# Patient Record
Sex: Male | Born: 1990 | Race: White | Hispanic: No | Marital: Married | Smoking: Never smoker
Health system: Southern US, Community
[De-identification: ages and names within clinical notes are randomized; demographics above are authoritative.]

## PROBLEM LIST (undated history)

## (undated) DIAGNOSIS — J45909 Unspecified asthma, uncomplicated: Secondary | ICD-10-CM

## (undated) DIAGNOSIS — E119 Type 2 diabetes mellitus without complications: Secondary | ICD-10-CM

## (undated) HISTORY — PX: FRACTURE SURGERY: SHX138

---
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
## 2016-01-29 ENCOUNTER — Ambulatory Visit: Payer: Self-pay | Admitting: Podiatry

## 2019-11-05 ENCOUNTER — Other Ambulatory Visit: Payer: Self-pay

## 2019-11-05 ENCOUNTER — Encounter (HOSPITAL_BASED_OUTPATIENT_CLINIC_OR_DEPARTMENT_OTHER): Payer: Self-pay

## 2019-11-05 ENCOUNTER — Emergency Department (HOSPITAL_BASED_OUTPATIENT_CLINIC_OR_DEPARTMENT_OTHER)
Admission: EM | Admit: 2019-11-05 | Discharge: 2019-11-06 | Disposition: A | Discharge status: Home / Self Care | Payer: 59 | Attending: Emergency Medicine | Admitting: Emergency Medicine

## 2019-11-05 ENCOUNTER — Emergency Department (HOSPITAL_BASED_OUTPATIENT_CLINIC_OR_DEPARTMENT_OTHER): Payer: 59

## 2019-11-05 DIAGNOSIS — S42022A Displaced fracture of shaft of left clavicle, initial encounter for closed fracture: Secondary | ICD-10-CM | POA: Insufficient documentation

## 2019-11-05 DIAGNOSIS — W19XXXA Unspecified fall, initial encounter: Secondary | ICD-10-CM | POA: Diagnosis not present

## 2019-11-05 DIAGNOSIS — Y92838 Other recreation area as the place of occurrence of the external cause: Secondary | ICD-10-CM | POA: Diagnosis not present

## 2019-11-05 DIAGNOSIS — F1210 Cannabis abuse, uncomplicated: Secondary | ICD-10-CM | POA: Diagnosis not present

## 2019-11-05 DIAGNOSIS — Y998 Other external cause status: Secondary | ICD-10-CM | POA: Insufficient documentation

## 2019-11-05 DIAGNOSIS — E119 Type 2 diabetes mellitus without complications: Secondary | ICD-10-CM | POA: Insufficient documentation

## 2019-11-05 DIAGNOSIS — Y9323 Activity, snow (alpine) (downhill) skiing, snow boarding, sledding, tobogganing and snow tubing: Secondary | ICD-10-CM | POA: Insufficient documentation

## 2019-11-05 DIAGNOSIS — S4992XA Unspecified injury of left shoulder and upper arm, initial encounter: Secondary | ICD-10-CM | POA: Diagnosis present

## 2019-11-05 HISTORY — DX: Type 2 diabetes mellitus without complications: E11.9

## 2019-11-05 MED ORDER — MORPHINE SULFATE 15 MG PO TABS: 15.0000 mg | ORAL_TABLET | ORAL | 0 refills | Status: DC | PRN

## 2019-11-05 NOTE — Discharge Instructions (Signed)

## 2019-11-05 NOTE — ED Triage Notes (Signed)
Pt was snowboarding and fell onto his L shoulder. Pt c/o pain to L collar bone.

## 2019-11-05 NOTE — ED Provider Notes (Signed)
[facility] EMERGENCY DEPARTMENT Provider Note   CSN: [id number] Arrival date & time: 11/05/19  2208     History Chief Complaint  Patient presents with  . Arm Injury    Omar Gentry is a 29 y.o. male29 yo .  29 yo M with a chief complaint of left shoulder pain.  Patient was snowboarding earlier today and he switched from going backwards to forward since he lost his balance and fell onto his left elbow.  Felt a pop in his left shoulder and had pain there afterwards.  Happened about 6 hours ago.  Denies other injury.  Pain with movement of the left shoulder.  Sharp and shooting.   The history is provided by the patient.  Arm Injury Location:  Clavicle Clavicle location:  L clavicle Injury: yes   Time since incident:  6 hours Mechanism of injury: fall   Fall:    Fall occurred: Snowboarding.   Height of fall:  Standing   Impact surface:  [employer] of impact: Left elbow.   Entrapped after fall: no   Pain details:    Quality:  Sharp   Radiates to:  Does not radiate   Severity:  Moderate   Onset quality:  Gradual   Duration:  6 hours   Timing:  Constant   Progression:  Worsening Handedness:  Right-handed Prior injury to area:  No Relieved by:  Nothing Worsened by:  Bearing weight and movement Ineffective treatments:  None tried Associated symptoms: no fever        Past Medical History:  Diagnosis Date  . Diabetes mellitus without complication (HCC)     There are no problems to display for this patient.   Past Surgical History:  Procedure Laterality Date  . FRACTURE SURGERY         No family history on file.  Social History   Tobacco Use  . Smoking status: Never Smoker  . Smokeless tobacco: Never Used  Substance Use Topics  . Alcohol use: Yes  . Drug use: Yes    Types: Marijuana    Home Medications Prior to Admission medications   Medication Sig Start Date End Date Taking? Authorizing Provider  morphine (MSIR) 15 MG tablet Take 1  tablet (15 mg total) by mouth every 4 (four) hours as needed for severe pain. 11/05/19   Melene Plan, DO    Allergies    Sulfa antibiotics  Review of Systems   Review of Systems  Constitutional: Negative for chills and fever.  HENT: Negative for congestion and facial swelling.   Eyes: Negative for discharge and visual disturbance.  Respiratory: Negative for shortness of breath.   Cardiovascular: Negative for chest pain and palpitations.  Gastrointestinal: Negative for abdominal pain, diarrhea and vomiting.  Musculoskeletal: Positive for arthralgias. Negative for myalgias.  Skin: Negative for color change and rash.  Neurological: Negative for tremors, syncope and headaches.  Psychiatric/Behavioral: Negative for confusion and dysphoric mood.    Physical Exam Updated Vital Signs BP (!) 159/79 (BP Location: Right Arm)   Pulse (!) 106   Temp 99.6 F (37.6 C) (Oral)   Resp 20   Ht 5\' 11"  (1.803 m)   Wt 81.6 kg   SpO2 97%   BMI 25.09 kg/m   Physical Exam Vitals and nursing note reviewed.  Constitutional:      Appearance: He is well-developed.  HENT:     Head: Normocephalic and atraumatic.  Eyes:     Pupils: Pupils are equal, round,  and reactive to light.  Neck:     Vascular: No JVD.  Cardiovascular:     Rate and Rhythm: Normal rate and regular rhythm.     Heart sounds: No murmur. No friction rub. No gallop.   Pulmonary:     Effort: No respiratory distress.     Breath sounds: No wheezing.  Abdominal:     General: There is no distension.     Tenderness: There is no guarding or rebound.  Musculoskeletal:        General: Tenderness present. Normal range of motion.     Cervical back: Normal range of motion and neck supple.     Comments: No tenting of the skin.  Tenderness about the left clavicle.  No pain at the elbow with full range of motion.  No pain to the humerus.  Pulse motor and sensation are intact distally.  Skin:    Coloration: Skin is not pale.     Findings: No  rash.  Neurological:     Mental Status: He is alert and oriented to person, place, and time.  Psychiatric:        Behavior: Behavior normal.     ED Results / Procedures / Treatments   Labs (all labs ordered are listed, but only abnormal results are displayed) Labs Reviewed - No data to display  EKG None  Radiology DG Clavicle Left  Result Date: 11/05/2019 CLINICAL DATA:  Fall while snowboarding EXAM: LEFT CLAVICLE - 2+ VIEWS COMPARISON:  None. FINDINGS: Multipart midclavicular fracture with superior angulation of the proximal and distal fragments in inferior angulation of the displaced middle fragment. Acromioclavicular alignment is maintained. Overlying soft tissue swelling is present. Glenohumeral alignment is grossly maintained. No other acute abnormality of the imaged left chest wall and lung. IMPRESSION: Multipart displaced midclavicular fracture. Electronically Signed   By: Kreg Shropshire M.D.   On: 11/05/2019 22:47    Procedures Procedures (including critical care time)  Medications Ordered in ED Medications - No data to display  ED Course  I have reviewed the triage vital signs and the nursing notes.  Pertinent labs & imaging results that were available during my care of the patient were reviewed by me and considered in my medical decision making (see chart for details).    MDM Rules/Calculators/A&P                      29 yo M with a chief complaints of left shoulder pain.  Plain films viewed by me with comminuted left clavicle fracture.  Will place in a sling.  Ortho follow-up.  11:56 PM:  I have discussed the diagnosis/risks/treatment options with the patient and believe the pt to be eligible for discharge home to follow-up with Ortho. We also discussed returning to the ED immediately if new or worsening sx occur. We discussed the sx which are most concerning (e.g., sudden worsening pain, fever, inability to tolerate by mouth) that necessitate immediate return.  Medications administered to the patient during their visit and any new prescriptions provided to the patient are listed below.  Medications given during this visit Medications - No data to display   The patient appears reasonably screen and/or stabilized for discharge and I doubt any other medical condition or other [facility] requiring further screening, evaluation, or treatment in the ED at this time prior to discharge.   Final Clinical Impression(s) / ED Diagnoses Final diagnoses:  Closed displaced fracture of shaft of left clavicle, initial encounter  Rx / DC Orders ED Discharge Orders         Ordered    morphine (MSIR) 15 MG tablet  Every 4 hours PRN     11/05/19 2347           Deno Etienne, DO 11/05/19 2356

## 2019-11-05 NOTE — ED Notes (Signed)
Pt endorses 1000mg  tylenol at 1730 with mild relief

## 2019-11-08 ENCOUNTER — Ambulatory Visit (INDEPENDENT_AMBULATORY_CARE_PROVIDER_SITE_OTHER): Payer: 59 | Admitting: Orthopaedic Surgery

## 2019-11-08 ENCOUNTER — Other Ambulatory Visit: Payer: Self-pay

## 2019-11-08 ENCOUNTER — Encounter: Payer: Self-pay | Admitting: Orthopaedic Surgery

## 2019-11-08 ENCOUNTER — Encounter (HOSPITAL_BASED_OUTPATIENT_CLINIC_OR_DEPARTMENT_OTHER): Payer: Self-pay | Admitting: Orthopaedic Surgery

## 2019-11-08 DIAGNOSIS — S42025A Nondisplaced fracture of shaft of left clavicle, initial encounter for closed fracture: Secondary | ICD-10-CM

## 2019-11-08 DIAGNOSIS — S42022A Displaced fracture of shaft of left clavicle, initial encounter for closed fracture: Secondary | ICD-10-CM | POA: Insufficient documentation

## 2019-11-08 NOTE — H&P (Signed)
Omar Gentry is an 29 y.o. male.   Chief Complaint: Left clavicle fracture HPI: 29 year old white male history of left clavicle fracture is being seen for preop evaluation.  Patient suffered this injury while snowboarding November 05, 2019.  Seen at the emergency room and put in a shoulder immobilizer.  Denies any other injuries.  Past Medical History:  Diagnosis Date  . Diabetes mellitus without complication [facility])     Past Surgical History:  Procedure Laterality Date  . FRACTURE SURGERY      No family history on file. Social History:  reports that he has never smoked. He has never used smokeless tobacco. He reports current alcohol use. He reports current drug use. Drug: Marijuana.  Allergies:  Allergies  Allergen Reactions  . Sulfa Antibiotics Rash    No medications prior to admission.    No results found for this or any previous visit (from the past 48 hour(s)). No results found.  Review of Systems  Constitutional: Positive for activity change.  HENT: Negative.   Respiratory: Negative.   Cardiovascular: Negative.   Gastrointestinal: Negative.   Genitourinary: Negative.   Musculoskeletal: Positive for neck stiffness.  Neurological: Negative.   Psychiatric/Behavioral: Negative.     There were no vitals taken for this visit. Physical Exam  Constitutional: He is oriented to person, place, and time. He appears well-developed and well-nourished.  HENT:  Head: Normocephalic and atraumatic.  Eyes: Pupils are equal, round, and reactive to light. EOM are normal.  Cardiovascular: Normal rate and normal heart sounds.  Respiratory: Effort normal. No respiratory distress. He has no wheezes.  GI: He exhibits no distension.  Musculoskeletal:        General: Tenderness present.     Cervical back: Normal range of motion.     Comments: Left shoulder immobilizer on  Neurological: He is alert and oriented to person, place, and time.  Skin: Skin is warm and dry.  Psychiatric: He has  a normal mood and affect.     Assessment/Plan Left clavicle fracture.  Patient understands at best treatment option at this point would be ORIF left clavicle.  Surgical procedure discussed.  All questions answered.  Zonia Kief, PA-C 11/08/2019, 4:31 PM

## 2019-11-08 NOTE — Progress Notes (Signed)
Office Visit Note   Patient: Omar Gentry           Date of Birth: Sep 10, 1991           MRN: [id number] Visit Date: 11/08/2019              Requested by: No referring provider defined for this encounter. PCP: Patient, No Pcp Per   Assessment & Plan: Visit Diagnoses:  1. Nondisplaced fracture of shaft of left clavicle, initial encounter for closed fracture     Plan: Patient had sent him MS Contin 15 mg for pain.  He states it made him nauseated and has been using ibuprofen.  Patient has type 1 diabetes risk surgery discussed.  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  We discussed being out of work for several days due to pain medication after the procedure.  Risks of infection was discussed low probability the plate would bothering the need to be removed.  Risk of possible nonunion discussed questions were elicited and answered he understands request to proceed.  He can follow-up 1 week postop.  Follow-Up Instructions: No follow-ups on file.   Orders:  No orders of the defined types were placed in this encounter.  No orders of the defined types were placed in this encounter.     Procedures: No procedures performed   Clinical Data: No additional findings.   Subjective: Chief Complaint  Patient presents with  . Left Shoulder - Fracture    DOI 11/05/2019 Left Clavicle Fracture    HPI 29 year old male was snowboarding at [facility] was reversed footed carving down and did a face plant suffering a displaced left clavicle fracture comminuted with greater than 2 cm displacement.  Date of injury is 11/05/2019 and patient states he wants to proceed with operative fixation to shorten his healing time per his dad's recommendations.  Review of Systems Patient is a type I diabetic on insulin otherwise no other  active health problems   Negative cardiovascular GI respiratory.  Objective: Vital Signs: Ht 5\' 11"  (1.803 m)   Wt  179 lb (81.2 kg)   BMI 24.97 kg/m   Physical Exam Constitutional:      Appearance: He is well-developed.  HENT:     Head: Normocephalic and atraumatic.  Eyes:     Pupils: Pupils are equal, round, and reactive to light.  Neck:     Thyroid: No thyromegaly.     Trachea: No tracheal deviation.  Cardiovascular:     Rate and Rhythm: Normal rate.  Pulmonary:     Effort: Pulmonary effort is normal.     Breath sounds: No wheezing.  Abdominal:     General: Bowel sounds are normal.     Palpations: Abdomen is soft.  Skin:    General: Skin is warm and dry.     Capillary Refill: Capillary refill takes less than 2 seconds.  Neurological:     Mental Status: He is alert and oriented to person, place, and time.  Psychiatric:        Behavior: Behavior normal.        Thought Content: Thought content normal.        Judgment: Judgment normal.     Ortho Exam patient has closed clavicle fracture with significant angulation and prominence of the skin.  Sensation hand is intact.  Axillary sensory lateral deltoid is intact.  Specialty Comments:  No specialty comments available.  Imaging: CLINICAL  DATA:  Fall while snowboarding  EXAM: LEFT CLAVICLE - 2+ VIEWS  COMPARISON:  None.  FINDINGS: Multipart midclavicular fracture with superior angulation of the proximal and distal fragments in inferior angulation of the displaced middle fragment. Acromioclavicular alignment is maintained. Overlying soft tissue swelling is present. Glenohumeral alignment is grossly maintained. No other acute abnormality of the imaged left chest wall and lung.  IMPRESSION: Multipart displaced midclavicular fracture.    PMFS History: Patient Active Problem List   Diagnosis Date Noted  . Nondisplaced fracture of shaft of left clavicle, initial encounter for closed fracture 11/08/2019   Past Medical History:  Diagnosis Date  . Diabetes mellitus without complication (HCC)     No family history on  file.  Past Surgical History:  Procedure Laterality Date  . FRACTURE SURGERY     Social History   Occupational History  . Not on file  Tobacco Use  . Smoking status: Never Smoker  . Smokeless tobacco: Never Used  Substance and Sexual Activity  . Alcohol use: Yes  . Drug use: Yes    Types: Marijuana  . Sexual activity: Not on file

## 2019-11-09 ENCOUNTER — Other Ambulatory Visit: Payer: Self-pay

## 2019-11-09 ENCOUNTER — Other Ambulatory Visit (HOSPITAL_COMMUNITY)
Admission: RE | Admit: 2019-11-09 | Discharge: 2019-11-09 | Disposition: A | Discharge status: Home / Self Care | Payer: 59 | Source: Ambulatory Visit | Attending: Orthopaedic Surgery | Admitting: Orthopaedic Surgery

## 2019-11-09 ENCOUNTER — Encounter (HOSPITAL_BASED_OUTPATIENT_CLINIC_OR_DEPARTMENT_OTHER)
Admission: RE | Admit: 2019-11-09 | Discharge: 2019-11-09 | Disposition: A | Discharge status: Home / Self Care | Payer: 59 | Source: Ambulatory Visit | Attending: Orthopaedic Surgery | Admitting: Orthopaedic Surgery

## 2019-11-09 ENCOUNTER — Ambulatory Visit: Payer: 59 | Admitting: Orthopedic Surgery

## 2019-11-09 DIAGNOSIS — Z01818 Encounter for other preprocedural examination: Secondary | ICD-10-CM | POA: Insufficient documentation

## 2019-11-09 DIAGNOSIS — Z01812 Encounter for preprocedural laboratory examination: Secondary | ICD-10-CM | POA: Diagnosis not present

## 2019-11-09 DIAGNOSIS — Z20822 Contact with and (suspected) exposure to covid-19: Secondary | ICD-10-CM | POA: Diagnosis not present

## 2019-11-09 LAB — COMPREHENSIVE METABOLIC PANEL
ALT: 18 U/L (ref 0–44)
AST: 22 U/L (ref 15–41)
Albumin: 4 g/dL (ref 3.5–5.0)
Alkaline Phosphatase: 66 U/L (ref 38–126)
Anion gap: 11 (ref 5–15)
BUN: 15 mg/dL (ref 6–20)
CO2: 24 mmol/L (ref 22–32)
Calcium: 9.3 mg/dL (ref 8.9–10.3)
Chloride: 101 mmol/L (ref 98–111)
Creatinine, Ser: 0.9 mg/dL (ref 0.61–1.24)
GFR calc Af Amer: >60 mL/min (ref >60)
GFR calc non Af Amer: >60 mL/min (ref >60)
Glucose, Bld: 286 mg/dL — ABNORMAL HIGH (ref 70–99)
Potassium: 5 mmol/L (ref 3.5–5.1)
Sodium: 136 mmol/L (ref 135–145)
Total Bilirubin: 0.3 mg/dL (ref 0.3–1.2)
Total Protein: 6.4 g/dL — ABNORMAL LOW (ref 6.5–8.1)

## 2019-11-09 LAB — CBC
HCT: 44.2 % (ref 39.0–52.0)
Hemoglobin: 15.2 g/dL (ref 13.0–17.0)
MCH: 33.9 pg (ref 26.0–34.0)
MCHC: 34.4 g/dL (ref 30.0–36.0)
MCV: 98.7 fL (ref 80.0–100.0)
Platelets: 302 10*3/uL (ref 150–400)
RBC: 4.48 MIL/uL (ref 4.22–5.81)
RDW: 11.7 % (ref 11.5–15.5)
WBC: 5.8 10*3/uL (ref 4.0–10.5)
nRBC: 0 % (ref 0.0–0.2)

## 2019-11-09 LAB — SURGICAL PCR SCREEN
MRSA, PCR: NEGATIVE
Staphylococcus aureus: NEGATIVE

## 2019-11-09 LAB — SARS CORONAVIRUS 2 (TAT 6-24 HRS): SARS Coronavirus 2: NEGATIVE

## 2019-11-09 NOTE — Progress Notes (Signed)
Glucose- 286, Dr. Arby Barrette aware, will proceed with surgery as scheduled.

## 2019-11-11 ENCOUNTER — Ambulatory Visit (HOSPITAL_COMMUNITY)
Admission: RE | Admit: 2019-11-11 | Discharge: 2019-11-11 | Disposition: A | Discharge status: Home / Self Care | Payer: 59 | Attending: Orthopaedic Surgery | Admitting: Orthopaedic Surgery

## 2019-11-11 ENCOUNTER — Ambulatory Visit (HOSPITAL_BASED_OUTPATIENT_CLINIC_OR_DEPARTMENT_OTHER): Payer: 59 | Admitting: Anesthesiology

## 2019-11-11 ENCOUNTER — Encounter (HOSPITAL_BASED_OUTPATIENT_CLINIC_OR_DEPARTMENT_OTHER): Payer: Self-pay | Admitting: Orthopaedic Surgery

## 2019-11-11 ENCOUNTER — Other Ambulatory Visit: Payer: Self-pay

## 2019-11-11 ENCOUNTER — Encounter (HOSPITAL_BASED_OUTPATIENT_CLINIC_OR_DEPARTMENT_OTHER)
Admission: RE | Disposition: A | Discharge status: Home / Self Care | Payer: Self-pay | Source: Home / Self Care | Attending: Orthopaedic Surgery

## 2019-11-11 ENCOUNTER — Ambulatory Visit (HOSPITAL_COMMUNITY): Payer: 59

## 2019-11-11 DIAGNOSIS — Y9323 Activity, snow (alpine) (downhill) skiing, snow boarding, sledding, tobogganing and snow tubing: Secondary | ICD-10-CM | POA: Insufficient documentation

## 2019-11-11 DIAGNOSIS — Z882 Allergy status to sulfonamides status: Secondary | ICD-10-CM | POA: Insufficient documentation

## 2019-11-11 DIAGNOSIS — Z87891 Personal history of nicotine dependence: Secondary | ICD-10-CM | POA: Diagnosis not present

## 2019-11-11 DIAGNOSIS — E109 Type 1 diabetes mellitus without complications: Secondary | ICD-10-CM | POA: Diagnosis not present

## 2019-11-11 DIAGNOSIS — Z419 Encounter for procedure for purposes other than remedying health state, unspecified: Secondary | ICD-10-CM

## 2019-11-11 DIAGNOSIS — J45909 Unspecified asthma, uncomplicated: Secondary | ICD-10-CM | POA: Insufficient documentation

## 2019-11-11 DIAGNOSIS — S42022A Displaced fracture of shaft of left clavicle, initial encounter for closed fracture: Secondary | ICD-10-CM | POA: Insufficient documentation

## 2019-11-11 HISTORY — DX: Unspecified asthma, uncomplicated: J45.909

## 2019-11-11 HISTORY — PX: ORIF CLAVICULAR FRACTURE: SHX5055

## 2019-11-11 LAB — GLUCOSE, CAPILLARY
Glucose-Capillary: 62 mg/dL — ABNORMAL LOW (ref 70–99)
Glucose-Capillary: 109 mg/dL — ABNORMAL HIGH (ref 70–99)
Glucose-Capillary: 134 mg/dL — ABNORMAL HIGH (ref 70–99)

## 2019-11-11 SURGERY — OPEN REDUCTION INTERNAL FIXATION (ORIF) CLAVICULAR FRACTURE
Anesthesia: General | Site: Shoulder | Laterality: Left

## 2019-11-11 MED ORDER — DEXMEDETOMIDINE HCL IN NACL 200 MCG/50ML IV SOLN
INTRAVENOUS | Status: AC
  Filled 2019-11-11: qty 100

## 2019-11-11 MED ORDER — PROPOFOL 10 MG/ML IV BOLUS
INTRAVENOUS | Status: AC
  Filled 2019-11-11: qty 20

## 2019-11-11 MED ORDER — CEFAZOLIN SODIUM-DEXTROSE 2-4 GM/100ML-% IV SOLN
2.0000 g | INTRAVENOUS | Status: AC
  Administered 2019-11-11: 10:00:00 2 g via INTRAVENOUS

## 2019-11-11 MED ORDER — DEXMEDETOMIDINE HCL 200 MCG/2ML IV SOLN
INTRAVENOUS | Status: DC | PRN
  Administered 2019-11-11: 8 ug via INTRAVENOUS
  Administered 2019-11-11: 16 ug via INTRAVENOUS
  Administered 2019-11-11 (×2): 8 ug via INTRAVENOUS

## 2019-11-11 MED ORDER — MIDAZOLAM HCL 2 MG/2ML IJ SOLN
INTRAMUSCULAR | Status: AC
  Filled 2019-11-11: qty 2

## 2019-11-11 MED ORDER — CHLORHEXIDINE GLUCONATE 4 % EX LIQD: 60.0000 mL | Freq: Once | CUTANEOUS | Status: DC

## 2019-11-11 MED ORDER — DEXAMETHASONE SODIUM PHOSPHATE 10 MG/ML IJ SOLN
INTRAMUSCULAR | Status: AC
  Filled 2019-11-11: qty 1

## 2019-11-11 MED ORDER — PROPOFOL 10 MG/ML IV BOLUS
INTRAVENOUS | Status: DC | PRN
  Administered 2019-11-11: 250 mg via INTRAVENOUS

## 2019-11-11 MED ORDER — LIDOCAINE 2% (20 MG/ML) 5 ML SYRINGE
INTRAMUSCULAR | Status: DC | PRN
  Administered 2019-11-11: 100 mg via INTRAVENOUS

## 2019-11-11 MED ORDER — FENTANYL CITRATE (PF) 100 MCG/2ML IJ SOLN
INTRAMUSCULAR | Status: AC
  Filled 2019-11-11: qty 2

## 2019-11-11 MED ORDER — DEXTROSE 50 % IV SOLN
INTRAVENOUS | Status: AC
  Filled 2019-11-11: qty 50

## 2019-11-11 MED ORDER — CEFAZOLIN SODIUM-DEXTROSE 2-4 GM/100ML-% IV SOLN
INTRAVENOUS | Status: AC
  Filled 2019-11-11: qty 100

## 2019-11-11 MED ORDER — FENTANYL CITRATE (PF) 100 MCG/2ML IJ SOLN
INTRAMUSCULAR | Status: DC | PRN
  Administered 2019-11-11 (×2): 50 ug via INTRAVENOUS
  Administered 2019-11-11: 25 ug via INTRAVENOUS
  Administered 2019-11-11: 50 ug via INTRAVENOUS
  Administered 2019-11-11: 25 ug via INTRAVENOUS

## 2019-11-11 MED ORDER — PROPOFOL 500 MG/50ML IV EMUL
INTRAVENOUS | Status: AC
  Filled 2019-11-11: qty 100

## 2019-11-11 MED ORDER — ONDANSETRON HCL 4 MG/2ML IJ SOLN
INTRAMUSCULAR | Status: DC | PRN
  Administered 2019-11-11: 4 mg via INTRAVENOUS

## 2019-11-11 MED ORDER — FENTANYL CITRATE (PF) 100 MCG/2ML IJ SOLN: 50.0000 ug | INTRAMUSCULAR | Status: DC | PRN

## 2019-11-11 MED ORDER — DEXTROSE 50 % IV SOLN
12.5000 g | Freq: Once | INTRAVENOUS | Status: AC
  Administered 2019-11-11: 10:00:00 12.5 g via INTRAVENOUS

## 2019-11-11 MED ORDER — OXYCODONE HCL 5 MG PO TABS: 5.0000 mg | ORAL_TABLET | Freq: Once | ORAL | Status: DC | PRN

## 2019-11-11 MED ORDER — LACTATED RINGERS IV SOLN: INTRAVENOUS | Status: DC

## 2019-11-11 MED ORDER — MIDAZOLAM HCL 5 MG/5ML IJ SOLN
INTRAMUSCULAR | Status: DC | PRN
  Administered 2019-11-11: 2 mg via INTRAVENOUS

## 2019-11-11 MED ORDER — KETOROLAC TROMETHAMINE 30 MG/ML IJ SOLN
INTRAMUSCULAR | Status: AC
  Filled 2019-11-11: qty 1

## 2019-11-11 MED ORDER — OXYCODONE-ACETAMINOPHEN 7.5-325 MG PO TABS: 1.0000 | ORAL_TABLET | Freq: Four times a day (QID) | ORAL | 0 refills | Status: DC | PRN

## 2019-11-11 MED ORDER — HYDROMORPHONE HCL 1 MG/ML IJ SOLN: 0.2500 mg | INTRAMUSCULAR | Status: DC | PRN

## 2019-11-11 MED ORDER — OXYCODONE HCL 5 MG/5ML PO SOLN: 5.0000 mg | Freq: Once | ORAL | Status: DC | PRN

## 2019-11-11 MED ORDER — METHOCARBAMOL 500 MG PO TABS: 500.0000 mg | ORAL_TABLET | Freq: Four times a day (QID) | ORAL | 0 refills | Status: AC

## 2019-11-11 MED ORDER — KETOROLAC TROMETHAMINE 30 MG/ML IJ SOLN
INTRAMUSCULAR | Status: DC | PRN
  Administered 2019-11-11: 30 mg via INTRAVENOUS

## 2019-11-11 MED ORDER — MIDAZOLAM HCL 2 MG/2ML IJ SOLN: 1.0000 mg | INTRAMUSCULAR | Status: DC | PRN

## 2019-11-11 MED ORDER — MEPERIDINE HCL 25 MG/ML IJ SOLN: 6.2500 mg | INTRAMUSCULAR | Status: DC | PRN

## 2019-11-11 MED ORDER — ONDANSETRON HCL 4 MG/2ML IJ SOLN
INTRAMUSCULAR | Status: AC
  Filled 2019-11-11: qty 2

## 2019-11-11 MED ORDER — BUPIVACAINE HCL 0.5 % IJ SOLN
INTRAMUSCULAR | Status: DC | PRN
  Administered 2019-11-11: 15 mL

## 2019-11-11 MED ORDER — DEXAMETHASONE SODIUM PHOSPHATE 10 MG/ML IJ SOLN
INTRAMUSCULAR | Status: DC | PRN
  Administered 2019-11-11: 4 mg via INTRAVENOUS

## 2019-11-11 MED ORDER — PROMETHAZINE HCL 25 MG/ML IJ SOLN: 6.2500 mg | INTRAMUSCULAR | Status: DC | PRN

## 2019-11-11 MED ORDER — LIDOCAINE 2% (20 MG/ML) 5 ML SYRINGE
INTRAMUSCULAR | Status: AC
  Filled 2019-11-11: qty 5

## 2019-11-11 SURGICAL SUPPLY — 57 items
APL SKNCLS STERI-STRIP NONHPOA (GAUZE/BANDAGES/DRESSINGS) ×1
BENZOIN TINCTURE PRP APPL 2/3 (GAUZE/BANDAGES/DRESSINGS) ×2 IMPLANT
BIT DRILL CLAV ALPS 2.7X145 (BIT) ×2 IMPLANT
BLADE SURG 15 STRL LF DISP TIS (BLADE) ×1 IMPLANT
BLADE SURG 15 STRL SS (BLADE) ×3
CLOSURE WOUND 1/2 X4 (GAUZE/BANDAGES/DRESSINGS)
DRAPE C-ARM 42X72 X-RAY (DRAPES) ×3 IMPLANT
DRAPE IMP U-DRAPE 54X76 (DRAPES) ×2 IMPLANT
DRAPE INCISE IOBAN 66X45 STRL (DRAPES) ×2 IMPLANT
DRAPE SURG 17X23 STRL (DRAPES) ×1 IMPLANT
DRAPE U-SHAPE 47X51 STRL (DRAPES) ×3 IMPLANT
DRAPE U-SHAPE 76X120 STRL (DRAPES) ×6 IMPLANT
DRSG EMULSION OIL 3X3 NADH (GAUZE/BANDAGES/DRESSINGS) ×1 IMPLANT
DURAPREP 26ML APPLICATOR (WOUND CARE) ×3 IMPLANT
ELECT REM PT RETURN 9FT ADLT (ELECTROSURGICAL) ×3
ELECTRODE REM PT RTRN 9FT ADLT (ELECTROSURGICAL) ×1 IMPLANT
GAUZE SPONGE 4X4 12PLY STRL (GAUZE/BANDAGES/DRESSINGS) ×3 IMPLANT
GLOVE BIOGEL PI IND STRL 6.5 (GLOVE) IMPLANT
GLOVE BIOGEL PI IND STRL 7.0 (GLOVE) IMPLANT
GLOVE BIOGEL PI IND STRL 8 (GLOVE) ×1 IMPLANT
GLOVE BIOGEL PI INDICATOR 6.5 (GLOVE) ×2
GLOVE BIOGEL PI INDICATOR 7.0 (GLOVE) ×4
GLOVE BIOGEL PI INDICATOR 8 (GLOVE) ×4
GLOVE ECLIPSE 6.5 STRL STRAW (GLOVE) ×2 IMPLANT
GLOVE ORTHO TXT STRL SZ7.5 (GLOVE) ×5 IMPLANT
GOWN STRL REUS W/ TWL LRG LVL3 (GOWN DISPOSABLE) ×1 IMPLANT
GOWN STRL REUS W/ TWL XL LVL3 (GOWN DISPOSABLE) ×1 IMPLANT
GOWN STRL REUS W/TWL LRG LVL3 (GOWN DISPOSABLE) ×6
GOWN STRL REUS W/TWL XL LVL3 (GOWN DISPOSABLE) ×6
K-WIRE TROCHAR TIP ALPS 1.6 (WIRE) ×6
KIT TURNOVER KIT B (KITS) ×3 IMPLANT
KWIRE TROCHAR TIP ALPS 1.6 (WIRE) IMPLANT
MANIFOLD NEPTUNE II (INSTRUMENTS) ×3 IMPLANT
NS IRRIG 1000ML POUR BTL (IV SOLUTION) ×3 IMPLANT
PACK ARTHROSCOPY DSU (CUSTOM PROCEDURE TRAY) ×3 IMPLANT
PACK BASIN DAY SURGERY FS (CUSTOM PROCEDURE TRAY) ×3 IMPLANT
PAD ARMBOARD 7.5X6 YLW CONV (MISCELLANEOUS) ×2 IMPLANT
PENCIL SMOKE EVACUATOR (MISCELLANEOUS) ×3 IMPLANT
PLATE CLAV ANT 140MM 3H (Plate) ×2 IMPLANT
SCREW CORT LP 3.5X12 (Screw) ×8 IMPLANT
SCREW CORT LP 3.5X14 (Screw) ×2 IMPLANT
SCREW CORT LP T15 3.5X16 (Screw) ×6 IMPLANT
SCREW LP NL T15 3.5X20 (Screw) ×2 IMPLANT
SCREW T15 LP CORT 3.5X10MM (Screw) ×2 IMPLANT
SCREW TIS LP 3.5X18 NS (Screw) ×2 IMPLANT
SLING ARM IMMOBILIZER LRG (SOFTGOODS) ×2 IMPLANT
SPONGE LAP 18X18 RF (DISPOSABLE) ×4 IMPLANT
STRIP CLOSURE SKIN 1/2X4 (GAUZE/BANDAGES/DRESSINGS) IMPLANT
SUCTION FRAZIER HANDLE 10FR (MISCELLANEOUS) ×2
SUCTION TUBE FRAZIER 10FR DISP (MISCELLANEOUS) ×1 IMPLANT
SUT PROLENE 3 0 PS 1 (SUTURE) ×1 IMPLANT
SUT VIC AB 0 CT1 27 (SUTURE) ×6
SUT VIC AB 0 CT1 27XCR 8 STRN (SUTURE) IMPLANT
SUT VIC AB 2-0 CT1 27 (SUTURE) ×3
SUT VIC AB 2-0 CT1 TAPERPNT 27 (SUTURE) ×1 IMPLANT
SYR BULB 3OZ (MISCELLANEOUS) ×2 IMPLANT
YANKAUER SUCT BULB TIP NO VENT (SUCTIONS) ×3 IMPLANT

## 2019-11-11 NOTE — Anesthesia Preprocedure Evaluation (Signed)
Anesthesia Evaluation  Patient identified by MRN, date of birth, ID band Patient awake    Reviewed: Allergy & Precautions, NPO status , Patient's Chart, lab work & pertinent test results  Airway Mallampati: II  TM Distance: >3 FB Neck ROM: Full    Dental no notable dental hx.    Pulmonary asthma , Patient abstained from smoking.,    Pulmonary exam normal breath sounds clear to auscultation       Cardiovascular negative cardio ROS Normal cardiovascular exam Rhythm:Regular Rate:Normal     Neuro/Psych negative neurological ROS  negative psych ROS   GI/Hepatic negative GI ROS, Neg liver ROS,   Endo/Other  negative endocrine ROSdiabetes, Type 1, Insulin Dependent  Renal/GU negative Renal ROS  negative genitourinary   Musculoskeletal negative musculoskeletal ROS (+)   Abdominal   Peds negative pediatric ROS (+)  Hematology negative hematology ROS (+)   Anesthesia Other Findings   Reproductive/Obstetrics negative OB ROS                             Anesthesia Physical Anesthesia Plan  ASA: III  Anesthesia Plan: General   Post-op Pain Management:    Induction: Intravenous  PONV Risk Score and Plan: 2 and Ondansetron, Midazolam and Treatment may vary due to age or medical condition  Airway Management Planned: LMA  Additional Equipment:   Intra-op Plan:   Post-operative Plan: Extubation in OR  Informed Consent: I have reviewed the patients History and Physical, chart, labs and discussed the procedure including the risks, benefits and alternatives for the proposed anesthesia with the patient or authorized representative who has indicated his/her understanding and acceptance.     Dental advisory given  Plan Discussed with: CRNA  Anesthesia Plan Comments:         Anesthesia Quick Evaluation

## 2019-11-11 NOTE — Interval H&P Note (Signed)
History and Physical Interval Note:  11/11/2019 10:06 AM  Omar Gentry  has presented today for surgery, with the diagnosis of left clavicle fracture.  The various methods of treatment have been discussed with the patient and family. After consideration of risks, benefits and other options for treatment, the patient has consented to  Procedure(s): OPEN REDUCTION INTERNAL FIXATION (ORIF) LEFT CLAVICULAR FRACTURE (Left) as a surgical intervention.  The patient's history has been reviewed, patient examined, no change in status, stable for surgery.  I have reviewed the patient's chart and labs.  Questions were answered to the patient's satisfaction.     Eldred Manges

## 2019-11-11 NOTE — Anesthesia Procedure Notes (Signed)
Procedure Name: LMA Insertion Date/Time: 11/11/2019 10:29 AM Performed by: Pearson Grippe, CRNA Pre-anesthesia Checklist: Patient identified, Emergency Drugs available, Suction available and Patient being monitored Patient Re-evaluated:Patient Re-evaluated prior to induction Oxygen Delivery Method: Circle system utilized Preoxygenation: Pre-oxygenation with 100% oxygen Induction Type: IV induction Ventilation: Mask ventilation without difficulty LMA: LMA inserted LMA Size: 5.0 Number of attempts: 1 Airway Equipment and Method: Bite block Placement Confirmation: positive ETCO2 Tube secured with: Tape Dental Injury: Teeth and Oropharynx as per pre-operative assessment

## 2019-11-11 NOTE — Anesthesia Postprocedure Evaluation (Signed)
Anesthesia Post Note  Patient: Omar Gentry  Procedure(s) Performed: OPEN REDUCTION INTERNAL FIXATION (ORIF) LEFT CLAVICULAR FRACTURE (Left Shoulder)     Patient location during evaluation: PACU Anesthesia Type: General Level of consciousness: awake and alert Pain management: pain level controlled Vital Signs Assessment: post-procedure vital signs reviewed and stable Respiratory status: spontaneous breathing, nonlabored ventilation and respiratory function stable Cardiovascular status: blood pressure returned to baseline and stable Postop Assessment: no apparent nausea or vomiting Anesthetic complications: no    Last Vitals:  Vitals:   11/11/19 1315 11/11/19 1334  BP: 97/83 108/63  Pulse: 71 64  Resp: 17 18  Temp:  37.1 C  SpO2: 99% 96%    Last Pain:  Vitals:   11/11/19 1334  TempSrc: Oral  PainSc: 2                  Lowella Curb

## 2019-11-11 NOTE — Op Note (Signed)
Preop diagnosis: Comminuted displaced closed left clavicle fracture.  Postop diagnosis: Same  Procedure: ORIF left clavicle.  Surgeon: Annell Greening, MD  Assistant: Zonia Kief, PA-C medically necessary and present for the entire procedure  Anesthesia: LMA general  Implants: Biomet 14 hole clavicle plate with screws x11.  Procedure: Abduction anesthesia beachchair position careful padding positioning 1015 drape were applied shoulder and clavicle region was prepped with DuraPrep.  Preoperative Ancef prophylaxis sterile skin marker over the clavicle and Betadine Steri-Drape sealing the skin.  Incision was made over the clavicle starting on the medial lateral aspect extending to the fracture site.  There was large butterfly fragment piece which was 3 cm long.  Reduction was difficult and there is significant high riding of the medial fragment of the clavicle.  Ultimately it was reduced plated been custom bent for the anterior surface of the clavicle it was secured holes drilled measured and placed medially followed by reduction laterally.  Distalmost end of the plate was snapped off.  Total of 11 screws were placed nonlocking.  This included securing the butterfly fragment.  Final spot C arm pictures were taken.  There was a couple millimeters displacement of the fracture but good stability.  Irrigation with saline solution reapproximation of the fascia overlying the clavicle with 0 Vicryl 2-0 Vicryl subtendinous tissue septic or closure tincture benzoin Steri-Strips postop dressing and sling was applied.  Marcaine was infiltrated at the time of closure and outpatient surgery with appropriate treatment this condition.

## 2019-11-11 NOTE — Transfer of Care (Signed)
Immediate Anesthesia Transfer of Care Note  Patient: Omar Gentry  Procedure(s) Performed: OPEN REDUCTION INTERNAL FIXATION (ORIF) LEFT CLAVICULAR FRACTURE (Left Shoulder)  Patient Location: PACU  Anesthesia Type:General  Level of Consciousness: drowsy and patient cooperative  Airway & Oxygen Therapy: Patient Spontanous Breathing and Patient connected to face mask oxygen  Post-op Assessment: Report given to RN and Post -op Vital signs reviewed and stable  Post vital signs: Reviewed and stable  Last Vitals:  Vitals Value Taken Time  BP 102/48 11/11/19 1240  Temp    Pulse 68 11/11/19 1241  Resp 12 11/11/19 1241  SpO2 100 % 11/11/19 1241  Vitals shown include unvalidated device data.  Last Pain:  Vitals:   11/11/19 0914  TempSrc: Oral  PainSc: 6       Patients Stated Pain Goal: 3 (11/11/19 0914)  Complications: No apparent anesthesia complications

## 2019-11-11 NOTE — Discharge Instructions (Addendum)
Remove dressing 48 hours postop and apply sterile gauze and tape.  Shoulder immobilizer must be on at all times.  Do not move arm.  Ice off an on as needed.    Do not shower  No driving.  No lifting, pushing, pulling with left arm.     Post Anesthesia Home Care Instructions  Activity: Get plenty of rest for the remainder of the day. A responsible individual must stay with you for 24 hours following the procedure.  For the next 24 hours, DO NOT: -Drive a car -Advertising copywriter -Drink alcoholic beverages -Take any medication unless instructed by your physician -Make any legal decisions or sign important papers.  Meals: Start with liquid foods such as gelatin or soup. Progress to regular foods as tolerated. Avoid greasy, spicy, heavy foods. If nausea and/or vomiting occur, drink only clear liquids until the nausea and/or vomiting subsides. Call your physician if vomiting continues.  Special Instructions/Symptoms: Your throat may feel dry or sore from the anesthesia or the breathing tube placed in your throat during surgery. If this causes discomfort, gargle with warm salt water. The discomfort should disappear within 24 hours.  If you had a scopolamine patch placed behind your ear for the management of post- operative nausea and/or vomiting:  1. The medication in the patch is effective for 72 hours, after which it should be removed.  Wrap patch in a tissue and discard in the trash. Wash hands thoroughly with soap and water. 2. You may remove the patch earlier than 72 hours if you experience unpleasant side effects which may include dry mouth, dizziness or visual disturbances. 3. Avoid touching the patch. Wash your hands with soap and water after contact with the patch.  No ibuprofen until after 6pm today

## 2019-11-15 ENCOUNTER — Encounter: Payer: Self-pay | Admitting: *Deleted

## 2019-11-18 ENCOUNTER — Ambulatory Visit (INDEPENDENT_AMBULATORY_CARE_PROVIDER_SITE_OTHER): Payer: 59

## 2019-11-18 ENCOUNTER — Other Ambulatory Visit: Payer: Self-pay

## 2019-11-18 ENCOUNTER — Ambulatory Visit (INDEPENDENT_AMBULATORY_CARE_PROVIDER_SITE_OTHER): Payer: 59 | Admitting: Orthopaedic Surgery

## 2019-11-18 DIAGNOSIS — S42025A Nondisplaced fracture of shaft of left clavicle, initial encounter for closed fracture: Secondary | ICD-10-CM

## 2019-11-18 MED ORDER — OXYCODONE-ACETAMINOPHEN 5-325 MG PO TABS: 1.0000 | ORAL_TABLET | Freq: Four times a day (QID) | ORAL | 0 refills | Status: AC | PRN

## 2019-11-18 NOTE — Progress Notes (Signed)
   Post-Op Visit Note   Patient: Omar Gentry           Date of Birth: April 21, 1991           MRN: [id number] Visit Date: 11/18/2019 PCP: Patient, No Pcp Per   Assessment & Plan:  Chief Complaint:  Chief Complaint  Patient presents with  . Left Shoulder - Routine Post Op   Visit Diagnoses:  1. 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  He is using his sling.  Percocet 7.5 is almost out.  30 tablets Percocet 5/325 sent in to his pharmacy.  He can use some ibuprofen as needed.  He will start doing some finger wall walking to stretch her shoulder.  Recheck 5 weeks repeat x-rays on return.  Follow-Up Instructions: Return in about 5 weeks (around 12/23/2019).   Orders:  Orders Placed This Encounter  Procedures  . XR Clavicle Left   No orders of the defined types were placed in this encounter.   Imaging: No results found.  PMFS History: Patient Active Problem List   Diagnosis Date Noted  . Displaced fracture of shaft of left clavicle 11/08/2019   Past Medical History:  Diagnosis Date  . Asthma   . Diabetes mellitus without complication (HCC)     No family history on file.  Past Surgical History:  Procedure Laterality Date  . FRACTURE SURGERY    . ORIF CLAVICULAR FRACTURE Left 11/11/2019   Procedure: OPEN REDUCTION INTERNAL FIXATION (ORIF) LEFT CLAVICULAR FRACTURE;  Surgeon: Eldred Manges, MD;  Location: [facility];  Service: Orthopedics;  Laterality: Left;   Social History   Occupational History  . Not on file  Tobacco Use  . Smoking status: Never Smoker  . Smokeless tobacco: Never Used  Substance and Sexual Activity  . Alcohol use: Yes  . Drug use: Yes    Types: Marijuana    Comment: occas  . Sexual activity: Not on file

## 2019-12-23 ENCOUNTER — Encounter: Payer: Self-pay | Admitting: Orthopaedic Surgery

## 2019-12-23 ENCOUNTER — Ambulatory Visit (INDEPENDENT_AMBULATORY_CARE_PROVIDER_SITE_OTHER): Payer: 59 | Admitting: Orthopaedic Surgery

## 2019-12-23 ENCOUNTER — Other Ambulatory Visit: Payer: Self-pay

## 2019-12-23 ENCOUNTER — Ambulatory Visit: Payer: Self-pay

## 2019-12-23 VITALS — Ht 71.0 in | Wt 177.0 lb

## 2019-12-23 DIAGNOSIS — S42025A Nondisplaced fracture of shaft of left clavicle, initial encounter for closed fracture: Secondary | ICD-10-CM | POA: Diagnosis not present

## 2019-12-23 NOTE — Progress Notes (Signed)
   Post-Op Visit Note   Patient: Omar Gentry           Date of Birth: 08-14-1991           MRN: [id number] Visit Date: 12/23/2019 PCP: Patient, No Pcp Per   Assessment & Plan: Follow-up ORIF left clavicle fracture patient is back to doing regular activity.  2 view x-rays show complete healing.  He can resume all normal workout activity.  He is happy with the surgical result.  Plate laterally sits up a few millimeters above the bone due to the shape of the clavicle but it is not tender.  If it did become bothersome he can get the plate removed in 6 months.  Follow-up as needed.  Chief Complaint:  Chief Complaint  Patient presents with  . Left Shoulder - Follow-up    11/11/2019 ORIF Left Clavicle Fx   Visit Diagnoses:  1. Nondisplaced fracture of shaft of left clavicle, initial encounter for closed fracture     Plan: Patient has full range of motion of the shoulder states she is probably at 90% strength.  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  Follow-Up Instructions: No follow-ups on file.   Orders:  Orders Placed This Encounter  Procedures  . XR Clavicle Left   No orders of the defined types were placed in this encounter.   Imaging: No results found.  PMFS History: Patient Active Problem List   Diagnosis Date Noted  . Displaced fracture of shaft of left clavicle 11/08/2019   Past Medical History:  Diagnosis Date  . Asthma   . Diabetes mellitus without complication (HCC)     No family history on file.  Past Surgical History:  Procedure Laterality Date  . FRACTURE SURGERY    . ORIF CLAVICULAR FRACTURE Left 11/11/2019   Procedure: OPEN REDUCTION INTERNAL FIXATION (ORIF) LEFT CLAVICULAR FRACTURE;  Surgeon: Eldred Manges, MD;  Location: [facility];  Service: Orthopedics;  Laterality: Left;   Social History   Occupational History  . Not on file  Tobacco Use  . Smoking status: Never Smoker  .  Smokeless tobacco: Never Used  Substance and Sexual Activity  . Alcohol use: Yes  . Drug use: Yes    Types: Marijuana    Comment: occas  . Sexual activity: Not on file

## 2020-09-22 IMAGING — CR DG CLAVICLE*L*
2 series · 2 of 2 positions shown · non-contrast
Comparison: None.

CLINICAL DATA: Fall while snowboarding

EXAM:
LEFT CLAVICLE - 2+ VIEWS

[w clavicle ap left *]
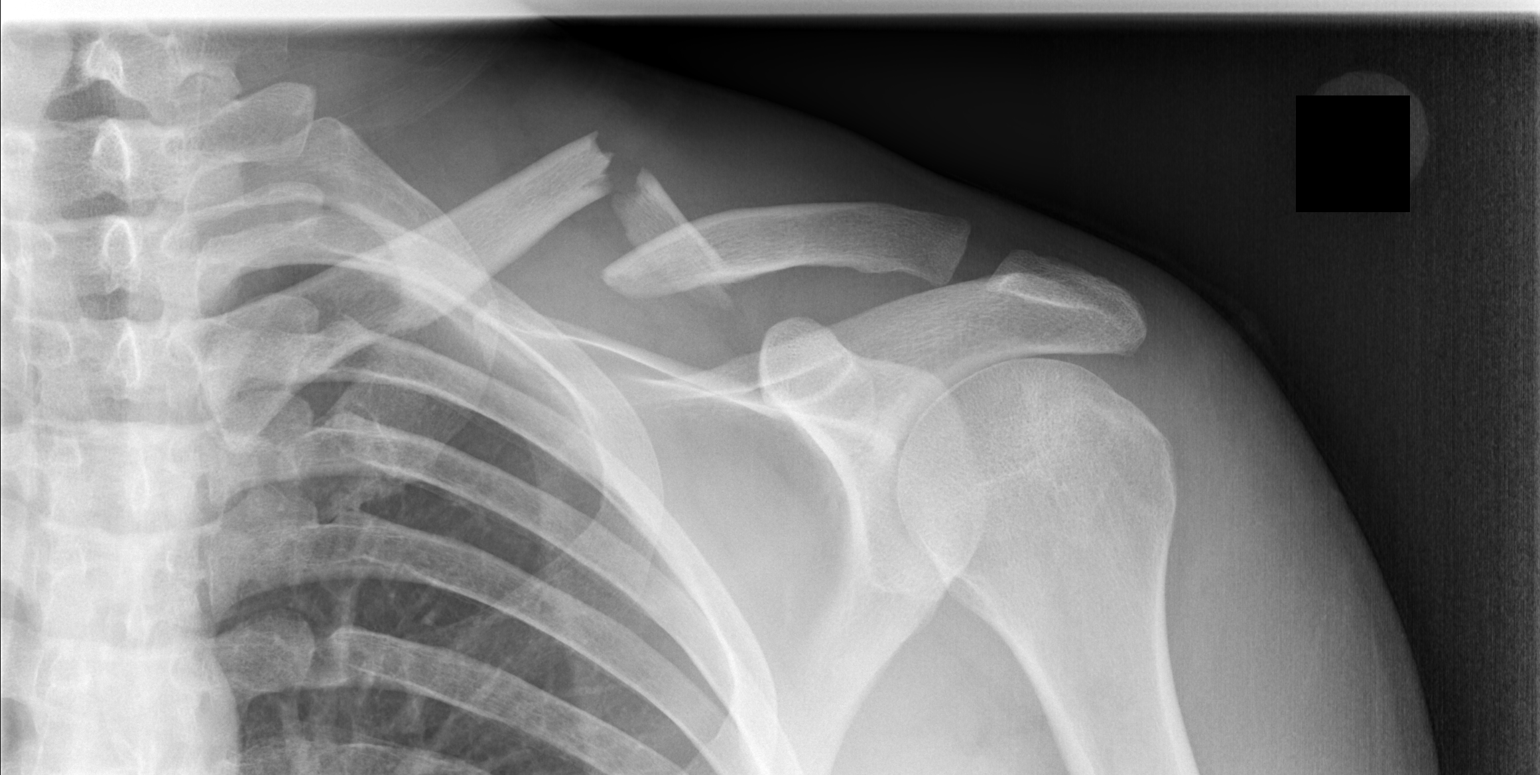

[w clavicle tangential left *]
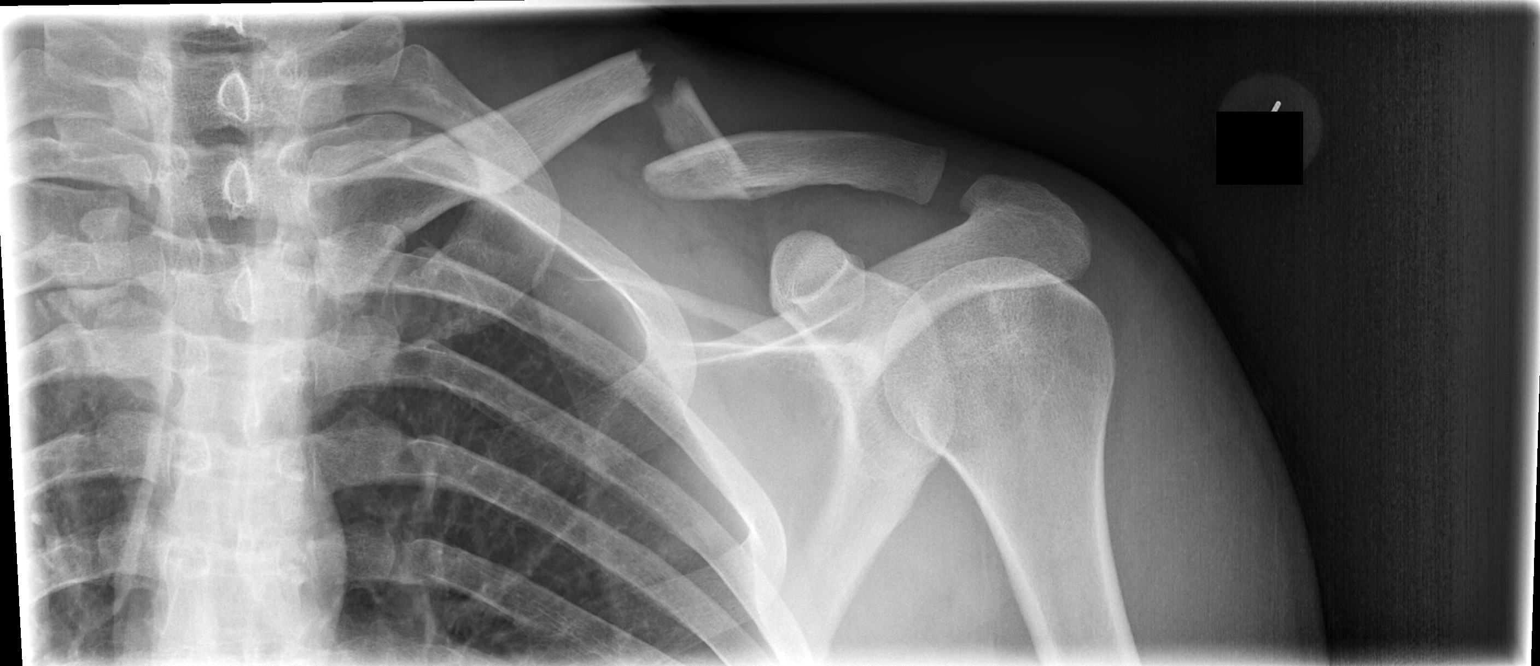

[2 of 2 positions shown; findings below may reference images not displayed]

FINDINGS: Multipart midclavicular fracture with superior angulation of the
proximal and distal fragments in inferior angulation of the
displaced middle fragment. Acromioclavicular alignment is
maintained. Overlying soft tissue swelling is present. Glenohumeral
alignment is grossly maintained. No other acute abnormality of the
imaged left chest wall and lung.
IMPRESSION: Multipart displaced midclavicular fracture.
